# Patient Record
Sex: Female | Born: 2000 | Race: Black or African American | Hispanic: No | Marital: Single
Health system: Southern US, Community
[De-identification: ages and names within clinical notes are randomized; demographics above are authoritative.]

## PROBLEM LIST (undated history)

## (undated) DIAGNOSIS — J45909 Unspecified asthma, uncomplicated: Secondary | ICD-10-CM

## (undated) DIAGNOSIS — G5603 Carpal tunnel syndrome, bilateral upper limbs: Secondary | ICD-10-CM

---
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
## 2020-06-25 ENCOUNTER — Other Ambulatory Visit: Payer: Self-pay

## 2020-06-25 ENCOUNTER — Emergency Department (HOSPITAL_COMMUNITY): Payer: BC Managed Care – PPO

## 2020-06-25 ENCOUNTER — Encounter (HOSPITAL_COMMUNITY): Payer: Self-pay | Admitting: *Deleted

## 2020-06-25 ENCOUNTER — Emergency Department (HOSPITAL_COMMUNITY)
Admission: EM | Admit: 2020-06-25 | Discharge: 2020-06-25 | Disposition: A | Discharge status: Home / Self Care | Payer: BC Managed Care – PPO | Attending: Emergency Medicine | Admitting: Emergency Medicine

## 2020-06-25 DIAGNOSIS — J45909 Unspecified asthma, uncomplicated: Secondary | ICD-10-CM | POA: Diagnosis not present

## 2020-06-25 DIAGNOSIS — M62830 Muscle spasm of back: Secondary | ICD-10-CM | POA: Diagnosis not present

## 2020-06-25 DIAGNOSIS — M549 Dorsalgia, unspecified: Secondary | ICD-10-CM | POA: Diagnosis present

## 2020-06-25 HISTORY — DX: Unspecified asthma, uncomplicated: J45.909

## 2020-06-25 HISTORY — DX: Carpal tunnel syndrome, bilateral upper limbs: G56.03

## 2020-06-25 MED ORDER — LIDOCAINE 5 % EX PTCH
1.0000 | MEDICATED_PATCH | CUTANEOUS | Status: DC
  Administered 2020-06-25: 1 via TRANSDERMAL
  Filled 2020-06-25: qty 1

## 2020-06-25 MED ORDER — METHOCARBAMOL 500 MG PO TABS: 500.0000 mg | ORAL_TABLET | Freq: Two times a day (BID) | ORAL | 0 refills | Status: AC

## 2020-06-25 MED ORDER — PROPOFOL 1000 MG/100ML IV EMUL
INTRAVENOUS | Status: AC
  Filled 2020-06-25: qty 100

## 2020-06-25 MED ORDER — KETOROLAC TROMETHAMINE 30 MG/ML IJ SOLN
30.0000 mg | Freq: Once | INTRAMUSCULAR | Status: AC
  Administered 2020-06-25: 30 mg via INTRAMUSCULAR
  Filled 2020-06-25: qty 1

## 2020-06-25 MED ORDER — DICLOFENAC SODIUM 50 MG PO TBEC: 50.0000 mg | DELAYED_RELEASE_TABLET | Freq: Two times a day (BID) | ORAL | 0 refills | Status: AC

## 2020-06-25 MED ORDER — CYCLOBENZAPRINE HCL 10 MG PO TABS
10.0000 mg | ORAL_TABLET | Freq: Once | ORAL | Status: AC
  Administered 2020-06-25: 10 mg via ORAL
  Filled 2020-06-25: qty 1

## 2020-06-25 NOTE — ED Triage Notes (Signed)
Pt with upper left back pain since this morning.  Denies any injury.

## 2020-06-25 NOTE — ED Provider Notes (Signed)
[facility] EMERGENCY DEPARTMENT Provider Note   CSN: [id number] Arrival date & time: 06/25/20  1342     History Chief Complaint  Patient presents with  . Back Pain    Sandra Salazar is a 20 y.o. female.  20 year old female presents with complaint of pain in her left back near the scapula, onset this morning, constant, worse with movement of her left shoulder (interneal and external rotation). Reports constant aching pain, sharp and spasming at times. Denies OCP use, tobacco use, recent extended travel, shortness of breath, inspiratory pain, fever, recent URI, falls or injuries. No history of PE/DVT. No other complaints or concerns.         Past Medical History:  Diagnosis Date  . Asthma   . Carpal tunnel syndrome on both sides     There are no problems to display for this patient.   History reviewed. No pertinent surgical history.   OB History   No obstetric history on file.     History reviewed. No pertinent family history.  Social History   Tobacco Use  . Smokeless tobacco: Never Used  Substance Use Topics  . Alcohol use: Never  . Drug use: Never    Home Medications Prior to Admission medications   Medication Sig Start Date End Date Taking? Authorizing Provider  diclofenac (VOLTAREN) 50 MG EC tablet Take 1 tablet (50 mg total) by mouth 2 (two) times daily for 10 days. 06/25/20 07/05/20 Yes Jeannie Fend, PA-C  methocarbamol (ROBAXIN) 500 MG tablet Take 1 tablet (500 mg total) by mouth 2 (two) times daily. 06/25/20  Yes Jeannie Fend, PA-C    Allergies    Eggs or egg-derived products and Other  Review of Systems   Review of Systems  Constitutional: Negative for fever.  Respiratory: Negative for shortness of breath.   Cardiovascular: Negative for chest pain and leg swelling.  Gastrointestinal: Negative for nausea and vomiting.  Musculoskeletal: Positive for back pain and myalgias. Negative for gait problem, joint swelling, neck pain and neck  stiffness.  Skin: Negative for color change, rash and wound.  Allergic/Immunologic: Negative for immunocompromised state.  Neurological: Negative for weakness and numbness.  All other systems reviewed and are negative.   Physical Exam Updated Vital Signs BP (!) 125/91 (BP Location: Right Arm)   Pulse (!) 102   Temp 98.8 F (37.1 C) (Oral)   Resp 16   Ht 5\' 4"  (1.626 m)   Wt 51.3 kg   SpO2 99%   BMI 19.40 kg/m   Physical Exam Vitals and nursing note reviewed.  Constitutional:      General: She is not in acute distress.    Appearance: She is well-developed. She is not diaphoretic.  HENT:     Head: Normocephalic and atraumatic.  Cardiovascular:     Rate and Rhythm: Normal rate and regular rhythm.     Pulses: Normal pulses.     Heart sounds: Normal heart sounds.  Pulmonary:     Effort: Pulmonary effort is normal.     Breath sounds: Normal breath sounds.  Chest:     Chest wall: No tenderness.  Musculoskeletal:        General: Tenderness present. No swelling or deformity.     Cervical back: Neck supple. No tenderness or bony tenderness.     Thoracic back: Tenderness present. No bony tenderness.     Lumbar back: No tenderness or bony tenderness.       Back:  Comments: Pain just below the scapula (left), worse/reproduced with internal and external rotation of the left shoulder.   Skin:    General: Skin is warm and dry.     Findings: No erythema or rash.  Neurological:     Mental Status: She is alert and oriented to person, place, and time.     Sensory: No sensory deficit.     Motor: No weakness.  Psychiatric:        Behavior: Behavior normal.     ED Results / Procedures / Treatments   Labs (all labs ordered are listed, but only abnormal results are displayed) Labs Reviewed - No data to display  EKG None  Radiology DG Shoulder Left  Result Date: 06/25/2020 CLINICAL DATA:  Left shoulder pain without known injury. EXAM: LEFT SHOULDER - 2+ VIEW COMPARISON:   None. FINDINGS: There is no evidence of fracture or dislocation. There is no evidence of arthropathy or other focal bone abnormality. Soft tissues are unremarkable. IMPRESSION: Negative. Electronically Signed   By: Lupita Raider M.D.   On: 06/25/2020 17:39    Procedures Procedures   Medications Ordered in ED Medications  lidocaine (LIDODERM) 5 % 1 patch (1 patch Transdermal Patch Applied 06/25/20 1617)  ketorolac (TORADOL) 30 MG/ML injection 30 mg (30 mg Intramuscular Given 06/25/20 1616)  cyclobenzaprine (FLEXERIL) tablet 10 mg (10 mg Oral Given 06/25/20 1616)    ED Course  I have reviewed the triage vital signs and the nursing notes.  Pertinent labs & imaging results that were available during my care of the patient were reviewed by me and considered in my medical decision making (see chart for details).  Clinical Course as of 06/25/20 1756  Tue Jun 25, 2020  [id number] 20 year old female with complaint of pain in the left upper back, deep to the left scapula, worse with internal and external rotation of the left arm.  Pain is also reproduced with palpation just inferior to the left scapula.  I suspect the pain she is having is musculoskeletal in origin, reports spasming at times in this area as well.  Patient was given Lidoderm patch, Toradol and Flexeril.  Patient is requesting x-ray, while this is expected to be normal, as ordered for reassurance. Plan is for discharge with muscle relaxant and anti-inflammatory.  Discussed warm compresses and gentle stretching of area and follow-up with PCP. [LM]    Clinical Course User Index [LM] Alden Hipp   MDM Rules/Calculators/A&P                          Final Clinical Impression(s) / ED Diagnoses Final diagnoses:  Muscle spasm of back    Rx / DC Orders ED Discharge Orders         Ordered    methocarbamol (ROBAXIN) 500 MG tablet  2 times daily        06/25/20 1746    diclofenac (VOLTAREN) 50 MG EC tablet  2 times daily         06/25/20 1746           Jeannie Fend, PA-C 06/25/20 1756    Eber Hong, MD 06/26/20 [phone]

## 2020-06-25 NOTE — Discharge Instructions (Signed)
Take Robaxin and diclofenac as prescribed.  Recommend warm compresses and gentle stretching as discussed.  Follow-up with your doctor if pain continues.

## 2022-09-22 IMAGING — DX DG SHOULDER 2+V*L*
3 series · 3 of 3 positions shown · non-contrast
Comparison: None.

CLINICAL DATA: Left shoulder pain without known injury.

EXAM:
LEFT SHOULDER - 2+ VIEW

[shoulder grashey]
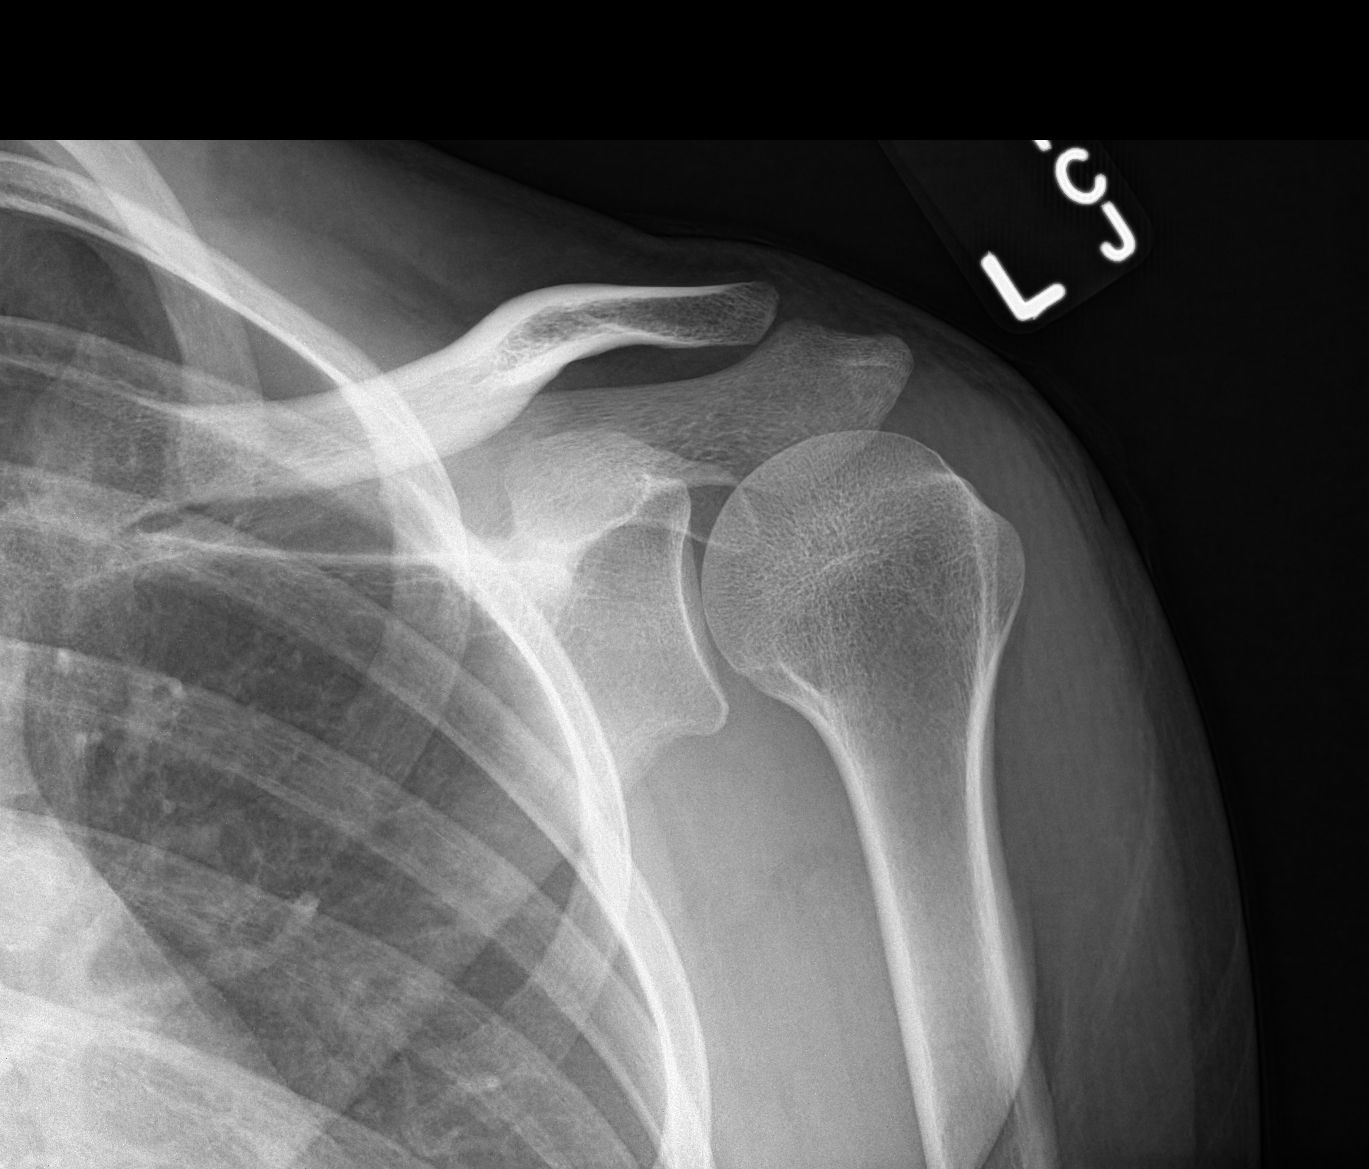

[shoulder y view]
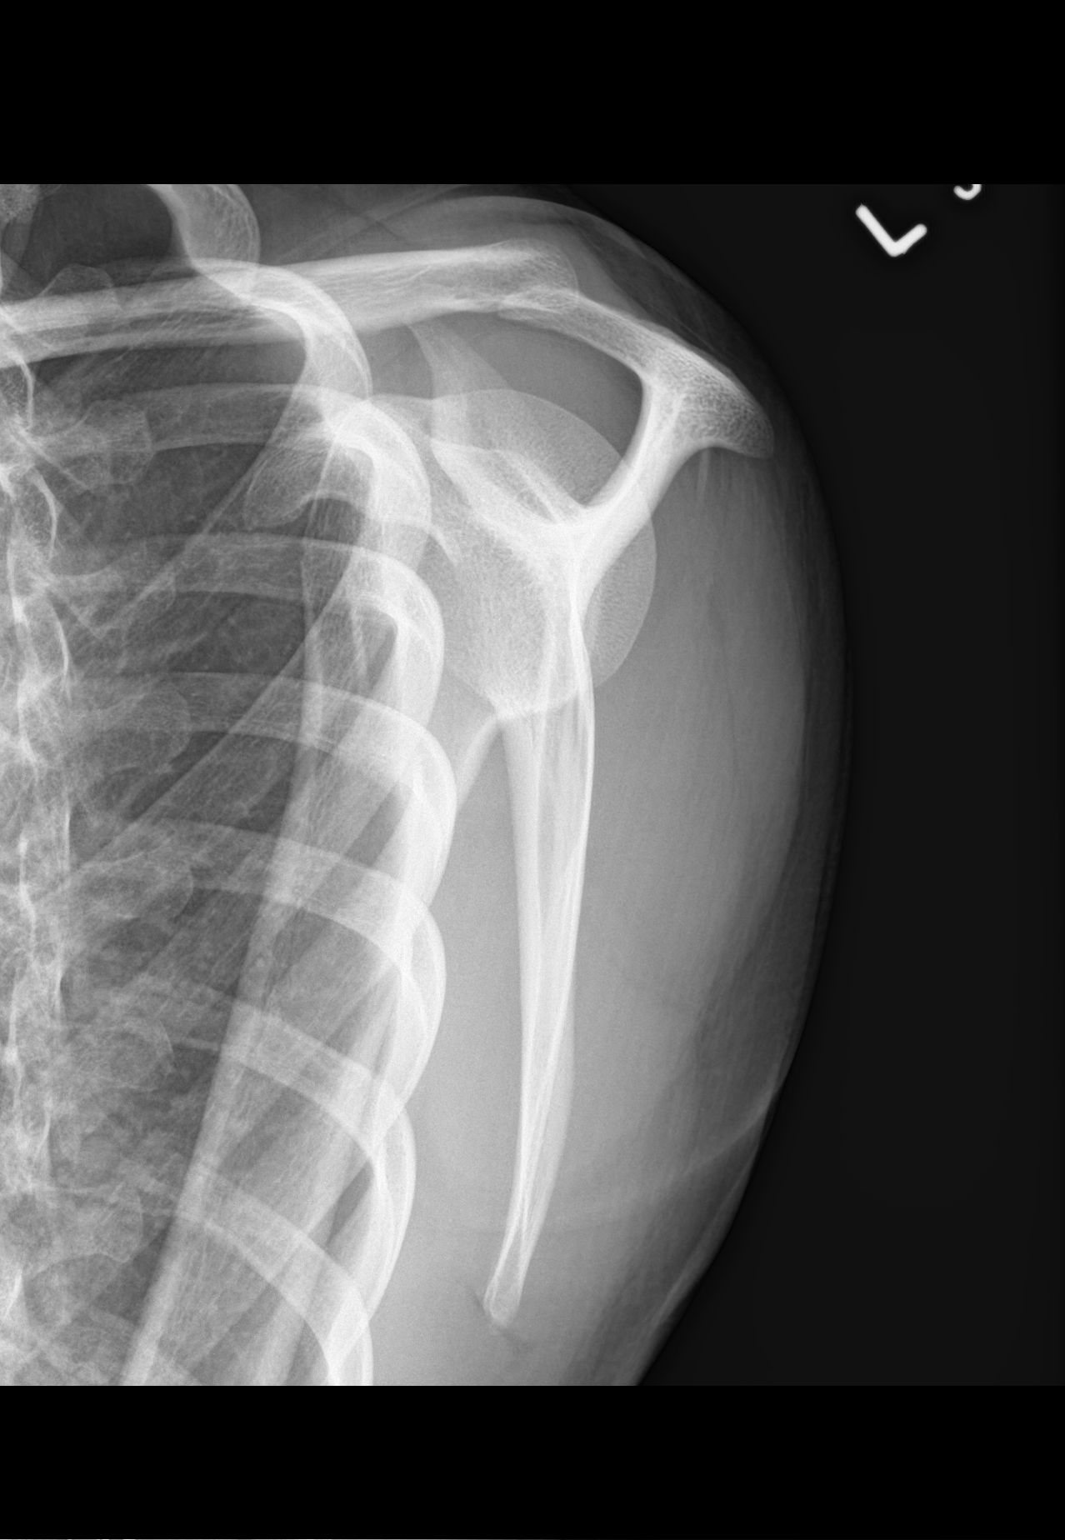

[shoulder axillary]
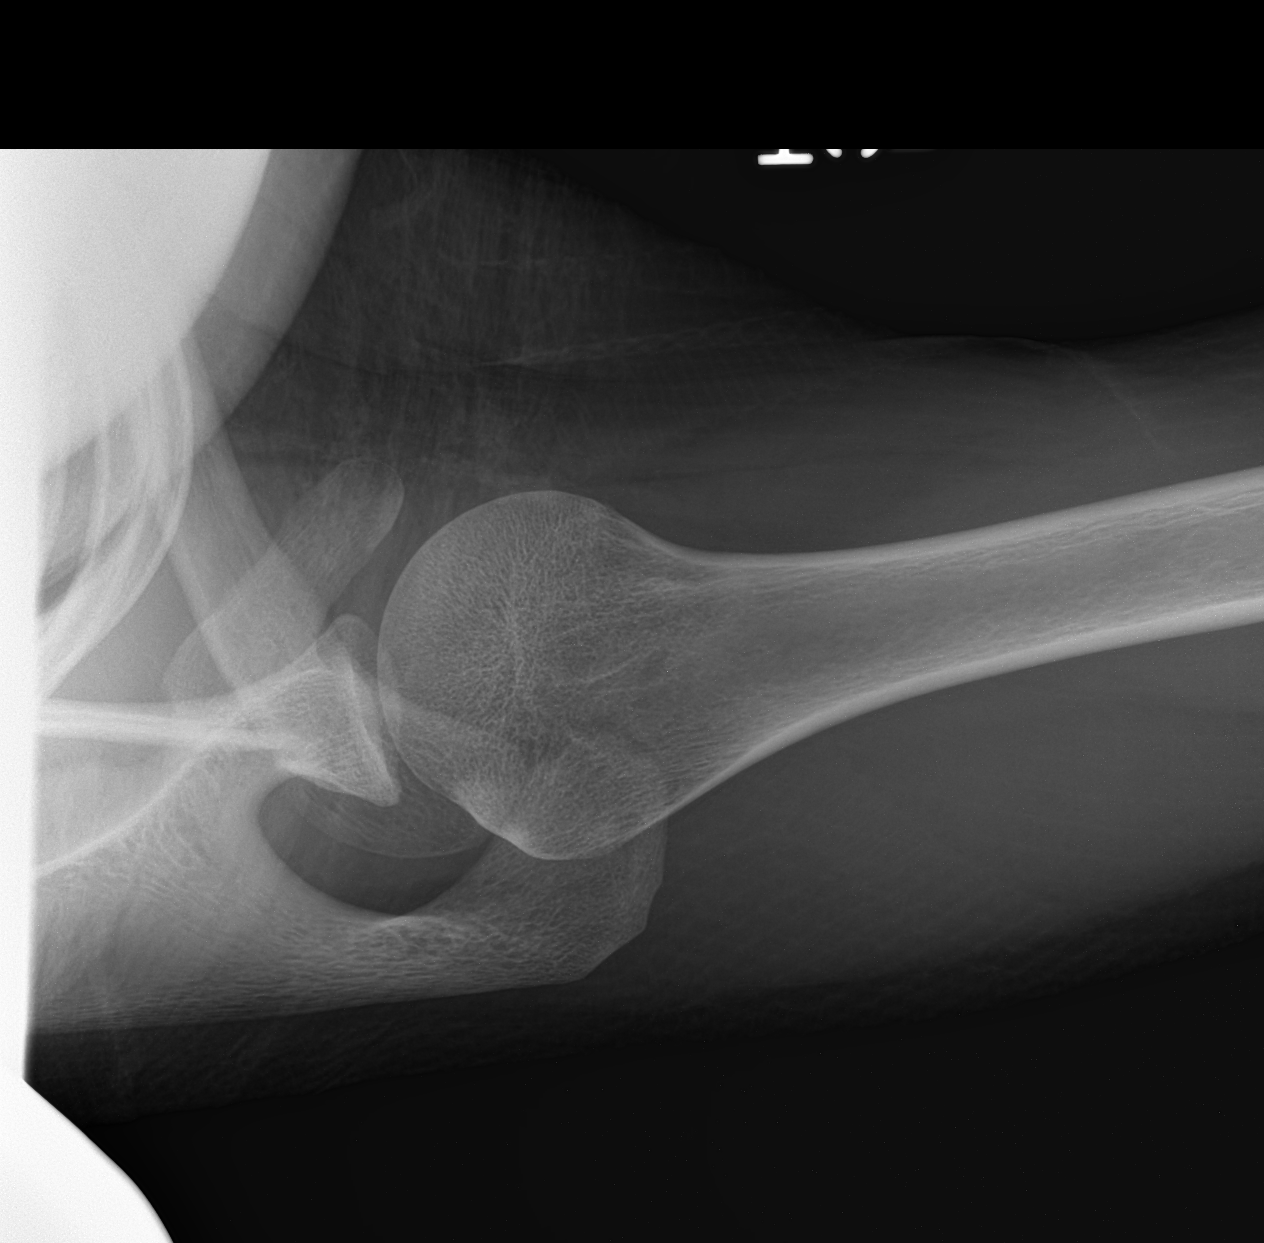

[3 of 3 positions shown; findings below may reference images not displayed]

FINDINGS: There is no evidence of fracture or dislocation. There is no
evidence of arthropathy or other focal bone abnormality. Soft
tissues are unremarkable.
IMPRESSION: Negative.
